# Patient Record
Sex: Female | Born: 1995 | Race: White | Hispanic: No | Marital: Single | Smoking: Never smoker
Health system: Southern US, Community
[De-identification: ages and names within clinical notes are randomized; demographics above are authoritative.]

## PROBLEM LIST (undated history)

## (undated) DIAGNOSIS — F4310 Post-traumatic stress disorder, unspecified: Secondary | ICD-10-CM

## (undated) DIAGNOSIS — F329 Major depressive disorder, single episode, unspecified: Secondary | ICD-10-CM

## (undated) DIAGNOSIS — F419 Anxiety disorder, unspecified: Secondary | ICD-10-CM

## (undated) DIAGNOSIS — F32A Depression, unspecified: Secondary | ICD-10-CM

## (undated) DIAGNOSIS — E079 Disorder of thyroid, unspecified: Secondary | ICD-10-CM

## (undated) HISTORY — PX: FOOT SURGERY: SHX648

---

## 2014-10-02 ENCOUNTER — Encounter (HOSPITAL_COMMUNITY): Payer: Self-pay

## 2014-10-02 ENCOUNTER — Emergency Department (HOSPITAL_COMMUNITY)
Admission: EM | Admit: 2014-10-02 | Discharge: 2014-10-03 | Disposition: A | Discharge status: Home / Self Care | Payer: BC Managed Care – PPO | Attending: Emergency Medicine | Admitting: Emergency Medicine

## 2014-10-02 DIAGNOSIS — R0682 Tachypnea, not elsewhere classified: Secondary | ICD-10-CM | POA: Diagnosis not present

## 2014-10-02 DIAGNOSIS — Z880 Allergy status to penicillin: Secondary | ICD-10-CM | POA: Insufficient documentation

## 2014-10-02 DIAGNOSIS — R0789 Other chest pain: Secondary | ICD-10-CM | POA: Insufficient documentation

## 2014-10-02 DIAGNOSIS — Z79899 Other long term (current) drug therapy: Secondary | ICD-10-CM | POA: Diagnosis not present

## 2014-10-02 DIAGNOSIS — R079 Chest pain, unspecified: Secondary | ICD-10-CM | POA: Diagnosis present

## 2014-10-02 LAB — CBC
HCT: 43.1 % (ref 36.0–46.0)
Hemoglobin: 14.5 g/dL (ref 12.0–15.0)
MCH: 30.3 pg (ref 26.0–34.0)
MCHC: 33.6 g/dL (ref 30.0–36.0)
MCV: 90 fL (ref 78.0–100.0)
Platelets: 331 10*3/uL (ref 150–400)
RBC: 4.79 MIL/uL (ref 3.87–5.11)
RDW: 12.8 % (ref 11.5–15.5)
WBC: 9 10*3/uL (ref 4.0–10.5)

## 2014-10-02 LAB — TROPONIN I

## 2014-10-02 LAB — BASIC METABOLIC PANEL
Anion gap: 10 (ref 5–15)
BUN: 21 mg/dL (ref 6–23)
CALCIUM: 9.4 mg/dL (ref 8.4–10.5)
CO2: 17 mmol/L — ABNORMAL LOW (ref 19–32)
Chloride: 107 mmol/L (ref 96–112)
Creatinine, Ser: 0.89 mg/dL (ref 0.50–1.10)
GFR calc Af Amer: >90 mL/min (ref >90)
GLUCOSE: 104 mg/dL — AB (ref 70–99)
Potassium: 3.9 mmol/L (ref 3.5–5.1)
Sodium: 134 mmol/L — ABNORMAL LOW (ref 135–145)

## 2014-10-02 NOTE — ED Notes (Signed)
Pt reports having chest tightness and not being able to catch a good breath. Pt has labored respirations with complaints of chest pain to the mid chest area and pt states that "it feels as if someone is stepping on me". Pt reports SOB started 2 weeks ago with it getting worst this week and cough since Christmas. Pt alert and oriented x4.

## 2014-10-02 NOTE — ED Notes (Signed)
Bed: [id number] Expected date:  Expected time:  Means of arrival:  Comments: PT CHANGING

## 2014-10-03 ENCOUNTER — Emergency Department (HOSPITAL_COMMUNITY): Payer: BC Managed Care – PPO

## 2014-10-03 LAB — D-DIMER, QUANTITATIVE (NOT AT ARMC): D-Dimer, Quant: <0.27 ug/mL-FEU (ref 0.00–0.48)

## 2014-10-03 MED ORDER — PREDNISONE 50 MG PO TABS: 50.0000 mg | ORAL_TABLET | Freq: Every day | ORAL | Status: DC

## 2014-10-03 MED ORDER — GUAIFENESIN ER 1200 MG PO TB12: 1.0000 | ORAL_TABLET | Freq: Two times a day (BID) | ORAL | Status: DC

## 2014-10-03 MED ORDER — OXYCODONE-ACETAMINOPHEN 5-325 MG PO TABS
1.0000 | ORAL_TABLET | Freq: Once | ORAL | Status: AC
  Administered 2014-10-03: 1 via ORAL
  Filled 2014-10-03: qty 1

## 2014-10-03 MED ORDER — HYDROCODONE-ACETAMINOPHEN 5-325 MG PO TABS: 1.0000 | ORAL_TABLET | Freq: Four times a day (QID) | ORAL | Status: DC | PRN

## 2014-10-03 NOTE — ED Provider Notes (Signed)
CSN: [id number]     Arrival date & time 10/02/14  2202 History   First MD Initiated Contact with Patient 10/02/14 2348     Chief Complaint  Patient presents with  . Shortness of Breath  . Chest Pain     (Consider location/radiation/quality/duration/timing/severity/associated sxs/prior Treatment) HPI Patient presents to the emergency department with a one-month history of cough and started having shortness of breath 2 weeks ago, has gotten worse over the last week.  Patient states that she has had chest pain constantly since yesterday, says it is worse with movement, palpation and deep breathing.  The patient states that she did not take any medications prior to arrival for her symptoms.  Patient denies nausea, vomiting, headache, blurred vision, weakness, dizziness, abdominal pain, fever or syncope.  The patient states that she has had 2 family members have been sick with pneumonia History reviewed. No pertinent past medical history. No past surgical history on file. History reviewed. No pertinent family history. History  Substance Use Topics  . Smoking status: Not on file  . Smokeless tobacco: Not on file  . Alcohol Use: Not on file   OB History    No data available     Review of Systems  All other systems negative except as documented in the HPI. All pertinent positives and negatives as reviewed in the HPI.   Allergies  Aspirin; Codeine; and Penicillins  Home Medications   Prior to Admission medications   Medication Sig Start Date End Date Taking? Authorizing Provider  aspirin-acetaminophen-caffeine (EXCEDRIN MIGRAINE) [phone] MG per tablet Take 1-2 tablets by mouth every 6 (six) hours as needed for migraine.   Yes Historical Provider, MD  medroxyPROGESTERone (DEPO-PROVERA) 150 MG/ML injection Inject 150 mg into the muscle every 3 (three) months.   Yes Historical Provider, MD  sertraline (ZOLOFT) 100 MG tablet Take 100 mg by mouth daily.   Yes Historical Provider, MD    BP 111/75 mmHg  Pulse 84  Temp(Src) 97.8 F (36.6 C) (Oral)  Resp 24  Ht  (1.753 m)  Wt 230 lb (104.327 kg)  BMI 33.95 kg/m2  SpO2 100%  LMP 10/02/2013 (Approximate) Physical Exam  Constitutional: She is oriented to person, place, and time. She appears well-developed and well-nourished. No distress.  HENT:  Head: Normocephalic and atraumatic.  Mouth/Throat: Oropharynx is clear and moist.  Eyes: Pupils are equal, round, and reactive to light.  Neck: Normal range of motion. Neck supple.  Cardiovascular: Normal rate, regular rhythm and normal heart sounds.  Exam reveals no gallop and no friction rub.   No murmur heard. Pulmonary/Chest: Effort normal and breath sounds normal. Tachypnea noted. She has no decreased breath sounds. She has no wheezes. She has no rales. She exhibits tenderness.  Neurological: She is alert and oriented to person, place, and time. She exhibits normal muscle tone. Coordination normal.  Skin: Skin is warm and dry.  Nursing note and vitals reviewed.   ED Course  Procedures (including critical care time) Labs Review Labs Reviewed  BASIC METABOLIC PANEL - Abnormal; Notable for the following:    Sodium 134 (*)    CO2 17 (*)    Glucose, Bld 104 (*)    All other components within normal limits  CBC  TROPONIN I  PREGNANCY, URINE    Imaging Review No results found.   Date: 10/03/2014  Rate: 115  Rhythm: sinus tachycardia  QRS Axis: normal  Intervals: normal  ST/T Wave abnormalities: normal  Conduction Disutrbances:none  Narrative Interpretation:  Old EKG Reviewed: none available  Patient does not have any significant abnormalities other than tachycardia noted on her vital signs the patient will be treated symptomatically.  Patient is advised to return here as needed.  Patient is told to increase her fluid intake.  Follow up with a primary care doctor.  Patient agrees the plan and all questions were answered.  Patient is low risk based on  well's criteria.  Patient denies smoking history to me  MDM   Final diagnoses:  None        Carlyle DollyChristopher W Lathen Seal, PA-C 10/05/14 0715  Olivia Mackielga M Otter, MD 10/05/14 [phone]

## 2014-10-03 NOTE — Discharge Instructions (Signed)
Return here as needed. Follow up with a primary doctor. °

## 2014-10-03 NOTE — ED Provider Notes (Signed)
[id number] - [facility] care [facility] at shift change. Patient pending d-dimer. Plan discussed with Lawyer, PA-C which includes discharge if d-dimer is negative. D-dimer results reviewed which are <0.27. Patient has remained hemodynamically stable without hypoxia. She is stable for discharge at this time. Return precautions given.  Results for orders placed or performed during the hospital encounter of 10/02/14  CBC  Result Value Ref Range   WBC 9.0 4.0 - 10.5 K/uL   RBC 4.79 3.87 - 5.11 MIL/uL   Hemoglobin 14.5 12.0 - 15.0 g/dL   HCT 11.943.1 14.736.0 - 82.946.0 %   MCV 90.0 78.0 - 100.0 fL   MCH 30.3 26.0 - 34.0 pg   MCHC 33.6 30.0 - 36.0 g/dL   RDW 56.212.8 13.011.5 - 86.515.5 %   Platelets 331 150 - 400 K/uL  Basic metabolic panel  Result Value Ref Range   Sodium 134 (L) 135 - 145 mmol/L   Potassium 3.9 3.5 - 5.1 mmol/L   Chloride 107 96 - 112 mmol/L   CO2 17 (L) 19 - 32 mmol/L   Glucose, Bld 104 (H) 70 - 99 mg/dL   BUN 21 6 - 23 mg/dL   Creatinine, Ser 7.840.89 0.50 - 1.10 mg/dL   Calcium 9.4 8.4 - 69.610.5 mg/dL   GFR calc non Af Amer >90 >90 mL/min   GFR calc Af Amer >90 >90 mL/min   Anion gap 10 5 - 15  Troponin I (MHP)  Result Value Ref Range   Troponin I <0.03 <0.031 ng/mL  D-dimer, quantitative  Result Value Ref Range   D-Dimer, Quant <0.27 0.00 - 0.48 ug/mL-FEU   Dg Chest 2 View  10/03/2014   CLINICAL DATA:  Patient is not fell well since 08/30/2014. Increasing shortness of breath for several days. Feels like someone is sitting on her chest.  EXAM: CHEST  2 VIEW  COMPARISON:  None.  FINDINGS: The heart size and mediastinal contours are within normal limits. Both lungs are clear. The visualized skeletal structures are unremarkable.  IMPRESSION: No active cardiopulmonary disease.   Electronically Signed   By: Burman NievesWilliam  Stevens M.D.   On: 10/03/2014 01:17      Antony MaduraKelly Rhylan Kagel, PA-C 10/03/14 0244  Olivia Mackielga M Otter, MD 10/03/14 [phone]

## 2015-05-06 ENCOUNTER — Emergency Department (HOSPITAL_COMMUNITY)
Admission: EM | Admit: 2015-05-06 | Discharge: 2015-05-06 | Disposition: A | Discharge status: Home / Self Care | Payer: BC Managed Care – PPO | Attending: Emergency Medicine | Admitting: Emergency Medicine

## 2015-05-06 ENCOUNTER — Encounter (HOSPITAL_COMMUNITY): Payer: Self-pay | Admitting: Emergency Medicine

## 2015-05-06 DIAGNOSIS — F329 Major depressive disorder, single episode, unspecified: Secondary | ICD-10-CM | POA: Diagnosis not present

## 2015-05-06 DIAGNOSIS — Z7952 Long term (current) use of systemic steroids: Secondary | ICD-10-CM | POA: Diagnosis not present

## 2015-05-06 DIAGNOSIS — R0602 Shortness of breath: Secondary | ICD-10-CM | POA: Diagnosis present

## 2015-05-06 DIAGNOSIS — F4310 Post-traumatic stress disorder, unspecified: Secondary | ICD-10-CM | POA: Diagnosis not present

## 2015-05-06 DIAGNOSIS — F410 Panic disorder [episodic paroxysmal anxiety] without agoraphobia: Secondary | ICD-10-CM | POA: Diagnosis not present

## 2015-05-06 DIAGNOSIS — R000 Tachycardia, unspecified: Secondary | ICD-10-CM | POA: Diagnosis not present

## 2015-05-06 DIAGNOSIS — Z79899 Other long term (current) drug therapy: Secondary | ICD-10-CM | POA: Diagnosis not present

## 2015-05-06 DIAGNOSIS — Z880 Allergy status to penicillin: Secondary | ICD-10-CM | POA: Insufficient documentation

## 2015-05-06 HISTORY — DX: Major depressive disorder, single episode, unspecified: F32.9

## 2015-05-06 HISTORY — DX: Post-traumatic stress disorder, unspecified: F43.10

## 2015-05-06 HISTORY — DX: Depression, unspecified: F32.A

## 2015-05-06 HISTORY — DX: Anxiety disorder, unspecified: F41.9

## 2015-05-06 MED ORDER — ACETAMINOPHEN 500 MG PO TABS
1000.0000 mg | ORAL_TABLET | Freq: Once | ORAL | Status: AC
  Administered 2015-05-06: 1000 mg via ORAL
  Filled 2015-05-06: qty 2

## 2015-05-06 MED ORDER — LORAZEPAM 1 MG PO TABS: 1.0000 mg | ORAL_TABLET | Freq: Once | ORAL | Status: DC

## 2015-05-06 MED ORDER — LORAZEPAM 2 MG/ML IJ SOLN
1.0000 mg | Freq: Once | INTRAMUSCULAR | Status: AC
  Administered 2015-05-06: 1 mg via INTRAVENOUS
  Filled 2015-05-06: qty 1

## 2015-05-06 NOTE — ED Provider Notes (Signed)
CSN: [id number]     Arrival date & time 05/06/15  1912 History   First MD Initiated Contact with Patient 05/06/15 1924     Chief Complaint  Patient presents with  . Panic Attack     (Consider location/radiation/quality/duration/timing/severity/associated sxs/prior Treatment) HPI Comments: Patient with past medical history of anxiety, depression, and PTSD, presents to the emergency department with chief complaint of panic attack. She states that she was hanging out with her friends at [facility] when she began to have some shortness of breath and a panic attack. She states that her panic attacks are rarely this bad, and that her symptoms improve with the help of her service dog. She has not tried taking anything to alleviate her symptoms. There are no aggravating factors.  The history is provided by the patient. No language interpreter was used.    Past Medical History  Diagnosis Date  . Anxiety   . Depression   . PTSD (post-traumatic stress disorder)    Past Surgical History  Procedure Laterality Date  . Foot surgery     No family history on file. Social History  Substance Use Topics  . Smoking status: Never Smoker   . Smokeless tobacco: None  . Alcohol Use: No   OB History    No data available     Review of Systems  Constitutional: Negative for fever and chills.  Respiratory: Negative for shortness of breath.   Cardiovascular: Negative for chest pain.  Gastrointestinal: Negative for nausea, vomiting, diarrhea and constipation.  Genitourinary: Negative for dysuria.  Psychiatric/Behavioral: The patient is nervous/anxious.       Allergies  Aspirin; Codeine; and Penicillins  Home Medications   Prior to Admission medications   Medication Sig Start Date End Date Taking? Authorizing Provider  aspirin-acetaminophen-caffeine (EXCEDRIN MIGRAINE) [phone] MG per tablet Take 1-2 tablets by mouth every 6 (six) hours as needed for migraine.    Historical Provider, MD   Guaifenesin 1200 MG TB12 Take 1 tablet (1,200 mg total) by mouth 2 (two) times daily. 10/03/14   Charlestine Night, PA-C  HYDROcodone-acetaminophen (NORCO/VICODIN) 5-325 MG per tablet Take 1 tablet by mouth every 6 (six) hours as needed for moderate pain. 10/03/14   Charlestine Night, PA-C  medroxyPROGESTERone (DEPO-PROVERA) 150 MG/ML injection Inject 150 mg into the muscle every 3 (three) months.    Historical Provider, MD  predniSONE (DELTASONE) 50 MG tablet Take 1 tablet (50 mg total) by mouth daily with breakfast. 10/03/14   Charlestine Night, PA-C  sertraline (ZOLOFT) 100 MG tablet Take 100 mg by mouth daily.    Historical Provider, MD   BP 117/97 mmHg  Pulse 112  Temp(Src) 98.1 F (36.7 C) (Oral)  Resp 30  SpO2 100% Physical Exam  Constitutional: She is oriented to person, place, and time. She appears well-developed and well-nourished.  HENT:  Head: Normocephalic and atraumatic.  Eyes: Conjunctivae and EOM are normal. Pupils are equal, round, and reactive to light.  Neck: Normal range of motion. Neck supple.  Cardiovascular: Regular rhythm.  Exam reveals no gallop and no friction rub.   No murmur heard. tachycardia  Pulmonary/Chest: Effort normal and breath sounds normal. No respiratory distress. She has no wheezes. She has no rales. She exhibits no tenderness.  tachypnea  Abdominal: Soft. Bowel sounds are normal. She exhibits no distension and no mass. There is no tenderness. There is no rebound and no guarding.  Musculoskeletal: Normal range of motion. She exhibits no edema or tenderness.  Neurological: She is alert and  oriented to person, place, and time.  Skin: Skin is warm and dry.  Psychiatric:  Very anxious  Nursing note and vitals reviewed.   ED Course  Procedures (including critical care time)   MDM   Final diagnoses:  Panic attack    Patient having panic attack. She is hyperventilating. Will give a dose of Ativan. Low risk for ACS, DVT, or PE.  8:06  PM Patient states that she is feeling better.  No longer hyperventilating.  States that she feels a little achy.  Will give a tylenol.  Symptoms seem consistent with panic attack.  Wells PE score is 1.5 indicating unlikely for PE.  ACS risk is low.  Symptoms are improving.  Hx of the same.  Anticipate DC to home.  9:30 PM Patient reassessed.  Symptom free.  DC to home.   Roxy Horseman, PA-C 05/06/15 2130  Tilden Fossa, MD 05/06/15 [phone]

## 2015-05-06 NOTE — Discharge Instructions (Signed)

## 2015-05-06 NOTE — ED Notes (Signed)
Pt was hanging out with friends at [facility] and began to have shortness of breath  Pt has hx of panic attacks  Pt is hyperventilating in triage  Pt has service dog with her

## 2015-06-19 ENCOUNTER — Encounter (HOSPITAL_COMMUNITY): Payer: Self-pay | Admitting: Emergency Medicine

## 2015-06-19 ENCOUNTER — Emergency Department (HOSPITAL_COMMUNITY)
Admission: EM | Admit: 2015-06-19 | Discharge: 2015-06-20 | Disposition: A | Discharge status: Home / Self Care | Payer: PPO | Attending: Emergency Medicine | Admitting: Emergency Medicine

## 2015-06-19 ENCOUNTER — Emergency Department (HOSPITAL_COMMUNITY): Payer: PPO

## 2015-06-19 DIAGNOSIS — F419 Anxiety disorder, unspecified: Secondary | ICD-10-CM | POA: Diagnosis not present

## 2015-06-19 DIAGNOSIS — R0789 Other chest pain: Secondary | ICD-10-CM

## 2015-06-19 DIAGNOSIS — E039 Hypothyroidism, unspecified: Secondary | ICD-10-CM | POA: Insufficient documentation

## 2015-06-19 DIAGNOSIS — F329 Major depressive disorder, single episode, unspecified: Secondary | ICD-10-CM | POA: Insufficient documentation

## 2015-06-19 DIAGNOSIS — R51 Headache: Secondary | ICD-10-CM

## 2015-06-19 DIAGNOSIS — G8929 Other chronic pain: Secondary | ICD-10-CM | POA: Insufficient documentation

## 2015-06-19 DIAGNOSIS — R000 Tachycardia, unspecified: Secondary | ICD-10-CM | POA: Diagnosis not present

## 2015-06-19 DIAGNOSIS — G43909 Migraine, unspecified, not intractable, without status migrainosus: Secondary | ICD-10-CM | POA: Insufficient documentation

## 2015-06-19 DIAGNOSIS — F4310 Post-traumatic stress disorder, unspecified: Secondary | ICD-10-CM | POA: Insufficient documentation

## 2015-06-19 DIAGNOSIS — R079 Chest pain, unspecified: Secondary | ICD-10-CM | POA: Diagnosis present

## 2015-06-19 DIAGNOSIS — R410 Disorientation, unspecified: Secondary | ICD-10-CM | POA: Diagnosis not present

## 2015-06-19 DIAGNOSIS — Z880 Allergy status to penicillin: Secondary | ICD-10-CM | POA: Diagnosis not present

## 2015-06-19 DIAGNOSIS — R519 Headache, unspecified: Secondary | ICD-10-CM

## 2015-06-19 HISTORY — DX: Disorder of thyroid, unspecified: E07.9

## 2015-06-19 LAB — BASIC METABOLIC PANEL
ANION GAP: 13 (ref 5–15)
BUN: 12 mg/dL (ref 6–20)
CHLORIDE: 107 mmol/L (ref 101–111)
CO2: 17 mmol/L — AB (ref 22–32)
Calcium: 9.9 mg/dL (ref 8.9–10.3)
Creatinine, Ser: 0.86 mg/dL (ref 0.44–1.00)
GFR calc non Af Amer: >60 mL/min (ref >60)
Glucose, Bld: 121 mg/dL — ABNORMAL HIGH (ref 65–99)
POTASSIUM: 4 mmol/L (ref 3.5–5.1)
Sodium: 137 mmol/L (ref 135–145)

## 2015-06-19 LAB — CBC
HEMATOCRIT: 48.6 % — AB (ref 36.0–46.0)
HEMOGLOBIN: 16.4 g/dL — AB (ref 12.0–15.0)
MCH: 30.6 pg (ref 26.0–34.0)
MCHC: 33.7 g/dL (ref 30.0–36.0)
MCV: 90.7 fL (ref 78.0–100.0)
Platelets: 351 10*3/uL (ref 150–400)
RBC: 5.36 MIL/uL — AB (ref 3.87–5.11)
RDW: 12.6 % (ref 11.5–15.5)
WBC: 13 10*3/uL — ABNORMAL HIGH (ref 4.0–10.5)

## 2015-06-19 LAB — I-STAT TROPONIN, ED: TROPONIN I, POC: 0 ng/mL (ref 0.00–0.08)

## 2015-06-19 MED ORDER — SODIUM CHLORIDE 0.9 % IV BOLUS (SEPSIS)
2000.0000 mL | Freq: Once | INTRAVENOUS | Status: AC
  Administered 2015-06-19: 2000 mL via INTRAVENOUS

## 2015-06-19 NOTE — ED Provider Notes (Signed)
CSN: [id number]   Arrival date & time 06/19/15 2123  History  By signing my name below, I, Bethel Born, attest that this documentation has been prepared under the direction and in the presence of Loren Racer, MD. Electronically Signed: Bethel Born, ED Scribe. 06/19/2015. 11:23 PM.  Chief Complaint  Patient presents with  . Chest Pain  . Anxiety  . Hypothyroidism  . Migraine    HPI The history is provided by the patient. No language interpreter was used.   Mary Contreras is a 19 y.o. female with history of hypothyroidism, anxiety, depression, and PTSD who presents to the Emergency Department complaining of constant central chest pain. Pt states that she does not know when the pain started and is "really confused".  She describes the pain as severe pressure. Associated symptoms include posterior headache. Pt has 2-3 headaches per day stating that "they don't usually go away". Today she used Excedrin with insufficient pain relief and does not remember how much of the medication she took. No sore throat, SOB, or new leg pain. She was recently started on Levothyroxine and states that she has been using it as prescribed. No personal history of DVT/PE. Pt is not on birth control. She denies alcohol and illicit drug use.  Past Medical History  Diagnosis Date  . Anxiety   . Depression   . PTSD (post-traumatic stress disorder)   . Thyroid disease     Past Surgical History  Procedure Laterality Date  . Foot surgery      History reviewed. No pertinent family history.  Social History  Substance Use Topics  . Smoking status: Never Smoker   . Smokeless tobacco: None  . Alcohol Use: No     Review of Systems  Constitutional: Negative for fever and chills.  Respiratory: Negative for shortness of breath.   Cardiovascular: Positive for chest pain. Negative for leg swelling.  Gastrointestinal: Negative for nausea, vomiting and abdominal pain.  Musculoskeletal: Negative for back  pain, neck pain and neck stiffness.  Skin: Negative for rash and wound.  Neurological: Positive for headaches. Negative for dizziness, weakness, light-headedness and numbness.  Psychiatric/Behavioral: Positive for confusion.  All other systems reviewed and are negative.  Home Medications   Prior to Admission medications   Medication Sig Start Date End Date Taking? Authorizing Provider  aspirin-acetaminophen-caffeine (EXCEDRIN MIGRAINE) [phone] MG tablet Take 2 tablets by mouth every 6 (six) hours as needed for headache or migraine.   Yes Historical Provider, MD  levothyroxine (SYNTHROID, LEVOTHROID) 50 MCG tablet Take 1 tablet by mouth daily. 06/10/15  Yes Historical Provider, MD  sertraline (ZOLOFT) 100 MG tablet Take 100 mg by mouth daily.   Yes Historical Provider, MD  Guaifenesin 1200 MG TB12 Take 1 tablet (1,200 mg total) by mouth 2 (two) times daily. Patient not taking: Reported on 05/06/2015 10/03/14   Charlestine Night, PA-C  metoCLOPramide (REGLAN) 10 MG tablet Take 1 tablet (10 mg total) by mouth every 6 (six) hours as needed for nausea (nausea/headache). 06/20/15   Loren Racer, MD    Allergies  Aspirin; Codeine; and Penicillins  Triage Vitals: BP 157/82 mmHg  Pulse 127  Temp(Src) 97.5 F (36.4 C) (Oral)  Resp 29  Ht  (1.778 m)  Wt 280 lb (127.007 kg)  BMI 40.18 kg/m2  SpO2 100%  LMP   Physical Exam  Constitutional: She is oriented to person, place, and time. She appears well-developed and well-nourished. No distress.  Drowsy but arousable  HENT:  Head: Normocephalic and atraumatic.  Mouth/Throat: Oropharynx is clear and moist. No oropharyngeal exudate.  Eyes: EOM are normal. Pupils are equal, round, and reactive to light.  Neck: Normal range of motion. Neck supple.  No meningismus. No posterior midline cervical tenderness to palpation.  Cardiovascular: Regular rhythm.  Exam reveals no gallop and no friction rub.   No murmur heard. Tachycardia   Pulmonary/Chest: Effort normal and breath sounds normal. No respiratory distress. She has no wheezes. She has no rales. She exhibits no tenderness.  Abdominal: Soft. Bowel sounds are normal. She exhibits no distension and no mass. There is no tenderness. There is no rebound and no guarding.  Musculoskeletal: Normal range of motion. She exhibits no edema or tenderness.  No lower extremity swelling or tenderness.  Neurological: She is alert and oriented to person, place, and time.  Patient oriented x3 with clear, goal oriented speech. Patient has 5/5 motor in all extremities. Sensation is intact to light touch. Bilateral finger-to-nose is normal with no signs of dysmetria. Patient has a normal gait and walks without assistance.  Skin: Skin is warm and dry. No rash noted. No erythema.  Nursing note and vitals reviewed.   ED Course  Procedures   DIAGNOSTIC STUDIES: Oxygen Saturation is 100% on RA, normal by my interpretation.    COORDINATION OF CARE: 11:21 PM Discussed treatment plan which includes lab work, CXR, and EKG with pt at bedside and pt agreed to plan.  1:28 AM I re-evaluated the patient and provided an update on the results of her lab work. She feels better.  Labs Reviewed  BASIC METABOLIC PANEL - Abnormal; Notable for the following:    CO2 17 (*)    Glucose, Bld 121 (*)    All other components within normal limits  CBC - Abnormal; Notable for the following:    WBC 13.0 (*)    RBC 5.36 (*)    Hemoglobin 16.4 (*)    HCT 48.6 (*)    All other components within normal limits  URINALYSIS, ROUTINE W REFLEX MICROSCOPIC (NOT AT [facility]) - Abnormal; Notable for the following:    Color, Urine AMBER (*)    APPearance CLOUDY (*)    Specific Gravity, Urine >1.046 (*)    Hgb urine dipstick LARGE (*)    Bilirubin Urine SMALL (*)    All other components within normal limits  HEPATIC FUNCTION PANEL - Abnormal; Notable for the following:    Bilirubin, Direct <0.1 (*)    All other  components within normal limits  URINE MICROSCOPIC-ADD ON - Abnormal; Notable for the following:    Squamous Epithelial / LPF FEW (*)    All other components within normal limits  ACETAMINOPHEN LEVEL  ETHANOL  URINE RAPID DRUG SCREEN, HOSP PERFORMED  TSH  D-DIMER, QUANTITATIVE (NOT AT [facility])  I-STAT TROPOININ, ED  Rosezena Sensor, ED    Imaging Review Dg Chest 2 View  06/19/2015  CLINICAL DATA:  19 year old female with acute chest pain and shortness of breath. EXAM: CHEST  2 VIEW COMPARISON:  None. FINDINGS: The cardiomediastinal silhouette is unremarkable. There is no evidence of focal airspace disease, pulmonary edema, suspicious pulmonary nodule/mass, pleural effusion, or pneumothorax. No acute bony abnormalities are identified. IMPRESSION: No active cardiopulmonary disease. Electronically Signed   By: Harmon Pier M.D.   On: 06/19/2015 22:24    I personally reviewed and evaluated these images and lab results as a part of my medical decision-making.   EKG Interpretation  Date/Time:  Thursday June 19 2015 21:36:50 EDT Ventricular Rate:  132 PR Interval:  138 QRS Duration: 73 QT Interval:  289 QTC Calculation: 428 R Axis:   91 Text Interpretation:  Sinus tachycardia Ventricular premature complex Borderline right axis deviation Baseline wander in lead(s) V3 V4 Confirmed by Ranae PalmsYELVERTON  MD, Kolin Erdahl ([id number]) on 06/19/2015 11:05:11 PM    MDM   Final diagnoses:  Chronic nonintractable headache, unspecified headache type  Atypical chest pain    I, Contina Strain, personally performed the services described in this documentation. All medical record entries made by the scribe were at my direction and in my presence.  I have reviewed the chart and discharge instructions and agree that the record reflects my personal performance and is accurate and complete. Chung Chagoya.  06/20/2015. 2:47 AM.    Patient is much more alert. Heart rate is under 100. Continues to complain of  headache but normal neurologic exam. Patient has chronic history of headaches daily. Initial EKG and troponin are normal. Low suspicion for coronary artery disease. D-dimer is negative. TSH is normal.  Patient states her headache is improved. Continues to have normal neurologic exam. We'll give referral to neurologist for chronic headaches. Return precautions given.  Loren Raceravid Noam Karaffa, MD 06/20/15 [phone]

## 2015-06-19 NOTE — ED Notes (Addendum)
Pt reports recent diagnosis with hypothyroidism (started on Levothyroxine). C/o mid chest pain with associated SOB. Pt appears very anxious at this time. Has hx panic attacks but says this does not feel similar. Also c/o intermittent migraines with pain at the base of her neck. Takes Excedrin for migraines- says, "I'm unsure how many I took today but it's not working." Also says, "I think I've been having seizures on and off. My friends say I look like I lose consciousness sometimes." No hx seizures, is not on seizure medication. Pt is alert in triage-appears to be hyperventilating. No nausea/vomiting/diarrhea. No other c/c.

## 2015-06-20 LAB — URINALYSIS, ROUTINE W REFLEX MICROSCOPIC
GLUCOSE, UA: NEGATIVE mg/dL
Ketones, ur: NEGATIVE mg/dL
LEUKOCYTES UA: NEGATIVE
Nitrite: NEGATIVE
PH: 5.5 (ref 5.0–8.0)
Protein, ur: NEGATIVE mg/dL
Urobilinogen, UA: 0.2 mg/dL (ref 0.0–1.0)

## 2015-06-20 LAB — HEPATIC FUNCTION PANEL
ALK PHOS: 73 U/L (ref 38–126)
ALT: 24 U/L (ref 14–54)
AST: 29 U/L (ref 15–41)
Albumin: 4.1 g/dL (ref 3.5–5.0)
BILIRUBIN TOTAL: 0.6 mg/dL (ref 0.3–1.2)
Total Protein: 7.5 g/dL (ref 6.5–8.1)

## 2015-06-20 LAB — D-DIMER, QUANTITATIVE (NOT AT ARMC)

## 2015-06-20 LAB — URINE MICROSCOPIC-ADD ON

## 2015-06-20 LAB — RAPID URINE DRUG SCREEN, HOSP PERFORMED
Amphetamines: NOT DETECTED
BARBITURATES: NOT DETECTED
BENZODIAZEPINES: NOT DETECTED
COCAINE: NOT DETECTED
OPIATES: NOT DETECTED
TETRAHYDROCANNABINOL: NOT DETECTED

## 2015-06-20 LAB — ACETAMINOPHEN LEVEL: Acetaminophen (Tylenol), Serum: 30 ug/mL (ref 10–30)

## 2015-06-20 LAB — I-STAT TROPONIN, ED: TROPONIN I, POC: 0 ng/mL (ref 0.00–0.08)

## 2015-06-20 LAB — ETHANOL: Alcohol, Ethyl (B): <5 mg/dL (ref <5)

## 2015-06-20 LAB — TSH: TSH: 3.224 u[IU]/mL (ref 0.350–4.500)

## 2015-06-20 MED ORDER — METOCLOPRAMIDE HCL 5 MG/ML IJ SOLN
10.0000 mg | Freq: Once | INTRAMUSCULAR | Status: AC
  Administered 2015-06-20: 10 mg via INTRAVENOUS
  Filled 2015-06-20: qty 2

## 2015-06-20 MED ORDER — METHYLPREDNISOLONE SODIUM SUCC 125 MG IJ SOLR
125.0000 mg | Freq: Once | INTRAMUSCULAR | Status: AC
  Administered 2015-06-20: 125 mg via INTRAVENOUS
  Filled 2015-06-20: qty 2

## 2015-06-20 MED ORDER — KETOROLAC TROMETHAMINE 30 MG/ML IJ SOLN
30.0000 mg | Freq: Once | INTRAMUSCULAR | Status: AC
  Administered 2015-06-20: 30 mg via INTRAVENOUS
  Filled 2015-06-20: qty 1

## 2015-06-20 MED ORDER — METOCLOPRAMIDE HCL 10 MG PO TABS: 10.0000 mg | ORAL_TABLET | Freq: Four times a day (QID) | ORAL | Status: AC | PRN

## 2015-06-20 NOTE — Discharge Instructions (Signed)

## 2015-06-20 NOTE — ED Notes (Signed)
Second liter of NS started per order

## 2015-06-20 NOTE — ED Notes (Signed)
MD at bedside. 

## 2015-06-21 ENCOUNTER — Encounter (HOSPITAL_COMMUNITY): Payer: Self-pay | Admitting: Emergency Medicine

## 2015-06-21 ENCOUNTER — Emergency Department (HOSPITAL_COMMUNITY)
Admission: EM | Admit: 2015-06-21 | Discharge: 2015-06-21 | Disposition: A | Discharge status: Home / Self Care | Payer: PPO | Attending: Emergency Medicine | Admitting: Emergency Medicine

## 2015-06-21 ENCOUNTER — Emergency Department (HOSPITAL_COMMUNITY): Payer: PPO

## 2015-06-21 DIAGNOSIS — R110 Nausea: Secondary | ICD-10-CM | POA: Insufficient documentation

## 2015-06-21 DIAGNOSIS — R109 Unspecified abdominal pain: Secondary | ICD-10-CM | POA: Diagnosis not present

## 2015-06-21 DIAGNOSIS — R569 Unspecified convulsions: Secondary | ICD-10-CM | POA: Diagnosis present

## 2015-06-21 DIAGNOSIS — Z79899 Other long term (current) drug therapy: Secondary | ICD-10-CM | POA: Insufficient documentation

## 2015-06-21 DIAGNOSIS — R51 Headache: Secondary | ICD-10-CM | POA: Insufficient documentation

## 2015-06-21 DIAGNOSIS — Z3202 Encounter for pregnancy test, result negative: Secondary | ICD-10-CM | POA: Diagnosis not present

## 2015-06-21 DIAGNOSIS — F4310 Post-traumatic stress disorder, unspecified: Secondary | ICD-10-CM | POA: Insufficient documentation

## 2015-06-21 DIAGNOSIS — Z880 Allergy status to penicillin: Secondary | ICD-10-CM | POA: Insufficient documentation

## 2015-06-21 DIAGNOSIS — F329 Major depressive disorder, single episode, unspecified: Secondary | ICD-10-CM | POA: Diagnosis not present

## 2015-06-21 DIAGNOSIS — R55 Syncope and collapse: Secondary | ICD-10-CM | POA: Insufficient documentation

## 2015-06-21 DIAGNOSIS — R000 Tachycardia, unspecified: Secondary | ICD-10-CM | POA: Insufficient documentation

## 2015-06-21 DIAGNOSIS — E079 Disorder of thyroid, unspecified: Secondary | ICD-10-CM | POA: Insufficient documentation

## 2015-06-21 DIAGNOSIS — F419 Anxiety disorder, unspecified: Secondary | ICD-10-CM | POA: Insufficient documentation

## 2015-06-21 DIAGNOSIS — R4020 Unspecified coma: Secondary | ICD-10-CM

## 2015-06-21 LAB — BASIC METABOLIC PANEL
Anion gap: 9 (ref 5–15)
BUN: 14 mg/dL (ref 6–20)
CHLORIDE: 110 mmol/L (ref 101–111)
CO2: 22 mmol/L (ref 22–32)
CREATININE: 0.93 mg/dL (ref 0.44–1.00)
Calcium: 8.9 mg/dL (ref 8.9–10.3)
GFR calc Af Amer: >60 mL/min (ref >60)
GFR calc non Af Amer: >60 mL/min (ref >60)
Glucose, Bld: 94 mg/dL (ref 65–99)
POTASSIUM: 3.6 mmol/L (ref 3.5–5.1)
SODIUM: 141 mmol/L (ref 135–145)

## 2015-06-21 LAB — CBC
HEMATOCRIT: 44.3 % (ref 36.0–46.0)
HEMOGLOBIN: 14.5 g/dL (ref 12.0–15.0)
MCH: 30.9 pg (ref 26.0–34.0)
MCHC: 32.7 g/dL (ref 30.0–36.0)
MCV: 94.3 fL (ref 78.0–100.0)
Platelets: 322 10*3/uL (ref 150–400)
RBC: 4.7 MIL/uL (ref 3.87–5.11)
RDW: 13 % (ref 11.5–15.5)
WBC: 8.5 10*3/uL (ref 4.0–10.5)

## 2015-06-21 LAB — CBG MONITORING, ED: Glucose-Capillary: 98 mg/dL (ref 65–99)

## 2015-06-21 LAB — HCG, QUANTITATIVE, PREGNANCY

## 2015-06-21 MED ORDER — DEXAMETHASONE 2 MG PO TABS
1.0000 mg | ORAL_TABLET | Freq: Once | ORAL | Status: AC
  Administered 2015-06-21: 1 mg via ORAL
  Filled 2015-06-21: qty 1

## 2015-06-21 MED ORDER — DIPHENHYDRAMINE HCL 25 MG PO CAPS
25.0000 mg | ORAL_CAPSULE | Freq: Once | ORAL | Status: AC
  Administered 2015-06-21: 25 mg via ORAL
  Filled 2015-06-21: qty 1

## 2015-06-21 MED ORDER — METOCLOPRAMIDE HCL 10 MG PO TABS
5.0000 mg | ORAL_TABLET | Freq: Once | ORAL | Status: AC
  Administered 2015-06-21: 5 mg via ORAL
  Filled 2015-06-21: qty 1

## 2015-06-21 MED ORDER — SODIUM CHLORIDE 0.9 % IV BOLUS (SEPSIS)
1000.0000 mL | Freq: Once | INTRAVENOUS | Status: AC
  Administered 2015-06-21: 1000 mL via INTRAVENOUS

## 2015-06-21 NOTE — ED Notes (Signed)
Nurse getting blood 

## 2015-06-21 NOTE — Discharge Instructions (Signed)

## 2015-06-21 NOTE — ED Notes (Signed)
Pt arrived to the ED with a complaint of a seizure.  Pt states she had a witnessed seizure 30 minutes ago.  Pt's friend states the seizure was for five minutes.  Pt states that she has had a lot going on and has been seen here recently.  Pt is not on any medications for seizures.

## 2015-06-21 NOTE — ED Provider Notes (Signed)
CSN: [id number]     Arrival date & time 06/21/15  1957 History   First MD Initiated Contact with Patient 06/21/15 2023     Chief Complaint  Patient presents with  . Seizures     (Consider location/radiation/quality/duration/timing/severity/associated sxs/prior Treatment) HPI Mary Contreras is a 19 y.o. female with PMH significant for anxiety, depression, PTSD, and thyroid disease who presents with seizure.  Patient reports that she has been experiencing seizures for the past month and they have increased in frequency to once a day.  Last seizure was today at 8 PM.  Patient states that she begins to have pins and needles feeling in her lower extremities and pain at the back of her head with visual disturbances (sees stars) and then has LOC and has jerking movements that begin in the legs and work towards her head.  Friend in the room states that they are jerking movements and the entire episode lasted about 5 minutes.  Patient reports that after the event her legs feel very stiff.  Friend reports that she seems lethargic and "out of it" for about 5 minutes.  Patient states she has never had any injury from these episodes.  Patient states that she goes to [facility] because it is free as she is a [employer]ing [employer] at [employer].  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 It appears she was brought in tonight by her friends who insisted that she go.  When asked why she had not mentioned these episodes to her PCP or another healthcare provider she said "I don't know.  i don't know why I don't tell people things".   Past Medical History  Diagnosis Date  . Anxiety   . Depression   . PTSD (post-traumatic stress disorder)   . Thyroid disease    Past Surgical History  Procedure Laterality Date  . Foot surgery     History reviewed. No pertinent family history. Social History  Substance Use Topics  . Smoking status:  Never Smoker   . Smokeless tobacco: None  . Alcohol Use: No   OB History    No data available     Review of Systems  Constitutional: Negative for chills and fatigue.  Respiratory: Negative for shortness of breath.   Cardiovascular: Negative for chest pain.  Gastrointestinal: Positive for nausea and abdominal pain. Negative for vomiting.  Genitourinary: Negative.   Neurological: Positive for headaches. Negative for dizziness, weakness, light-headedness and numbness.      Allergies  Aspirin; Codeine; and Penicillins  Home Medications   Prior to Admission medications   Medication Sig Start Date End Date Taking? Authorizing Provider  levothyroxine (SYNTHROID, LEVOTHROID) 50 MCG tablet Take 1 tablet by mouth daily. 06/10/15  Yes Historical Provider, MD  sertraline (ZOLOFT) 100 MG tablet Take 100 mg by mouth daily.   Yes Historical Provider, MD  aspirin-acetaminophen-caffeine (EXCEDRIN MIGRAINE) [phone] MG tablet Take 2 tablets by mouth every 6 (six) hours as needed for headache or migraine.    Historical Provider, MD  metoCLOPramide (REGLAN) 10 MG tablet Take 1 tablet (10 mg total) by mouth every 6 (six) hours as needed for nausea (nausea/headache). 06/20/15   Loren Raceravid [place]el[place], MD   BP 131/64 mmHg  Pulse 90  Temp(Src) 98.1 F (36.7 C) (Oral)  Resp 17  Ht 5\' 10"  (1.778 m)  Wt 280 lb (127.007 kg)  BMI 40.18 kg/m2  SpO2 100% Physical  Exam  Constitutional: She is oriented to person, place, and time. She appears well-developed and well-nourished.  HENT:  Head: Normocephalic and atraumatic.  Mouth/Throat: Oropharynx is clear and moist.  Eyes: Conjunctivae and EOM are normal. Pupils are equal, round, and reactive to light.  Neck: Normal range of motion. Neck supple.  Cardiovascular: Normal rate, regular rhythm and normal heart sounds.   No murmur heard. Pulmonary/Chest: Effort normal and breath sounds normal. No accessory muscle usage or stridor. No respiratory distress. She has  no wheezes. She has no rhonchi. She has no rales.  Abdominal: Soft. Bowel sounds are normal. She exhibits no distension. There is no tenderness.  Musculoskeletal: Normal range of motion.  Lymphadenopathy:    She has no cervical adenopathy.  Neurological: She is alert and oriented to person, place, and time.  Speech clear without dysarthria.  Cranial nerves grossly intact.  Strength and sensation intact b/l throughout upper extremities.  Sensation and strength intact in lower extremities, decreased bilaterally.  RAMs intact.  Finger to nose intact without ataxia.  Skin: Skin is warm and dry.  Psychiatric: She has a normal mood and affect. Her behavior is normal.    ED Course  Procedures (including critical care time) Labs Review Labs Reviewed  BASIC METABOLIC PANEL  HCG, QUANTITATIVE, PREGNANCY  CBC  CBG MONITORING, ED    Imaging Review Ct Head Wo Contrast  06/21/2015  CLINICAL DATA:  Seizures EXAM: CT HEAD WITHOUT CONTRAST TECHNIQUE: Contiguous axial images were obtained from the base of the skull through the vertex without intravenous contrast. COMPARISON:  None. FINDINGS: There is no evidence of mass effect, midline shift or extra-axial fluid collections. There is no evidence of a space-occupying lesion or intracranial hemorrhage. There is no evidence of a cortical-based area of acute infarction. The ventricles and sulci are appropriate for the patient's age. The basal cisterns are patent. Visualized portions of the orbits are unremarkable. The visualized portions of the paranasal sinuses and mastoid air cells are unremarkable. The osseous structures are unremarkable. IMPRESSION: Normal CT of the brain without intravenous contrast. Electronically Signed   By: Elige Ko   On: 06/21/2015 21:36   I have personally reviewed and evaluated these images and lab results as part of my medical decision-making.   EKG Interpretation None      MDM   Final diagnoses:  Loss of consciousness     Patient presents with possible seizure.  VSS, mild tachycardia on arrival.  Afebrile.  On exam, no focal neurological deficits.  Decreased sensation and strength in lower extremities bilaterally.  Abdomen soft, nontender, no rebound or guarding.  Lungs CTAB, heart RRR.  Labs include BMP, hcg, CBC, CBG.  Imaging include head CT.  Will consult neurology.   -Head CT shows normal CT of brain. -BMP and CBC unremarkable -hcg negative -UDS from previous ED visit negative  Per neurology, no driving or operative heavy machinery until further evaluation.  No medication initiation at this time. Patient stable for discharge.  Patient agrees and acknowledges the above plan for discharge.  Case has been discussed with Dr. Freida Busman who agrees with the above plan for discharge.    Cheri Fowler, PA-C 06/21/15 2242  Lorre Nick, MD 06/22/15 [phone]

## 2015-06-21 NOTE — ED Notes (Signed)
Patient transported to CT 

## 2016-01-09 IMAGING — CR DG CHEST 2V
2 series · 2 of 2 positions shown · non-contrast
Comparison: None.

CLINICAL DATA: 19-year-old female with acute chest pain and
shortness of breath.

EXAM:
CHEST  2 VIEW

[w chest lat]
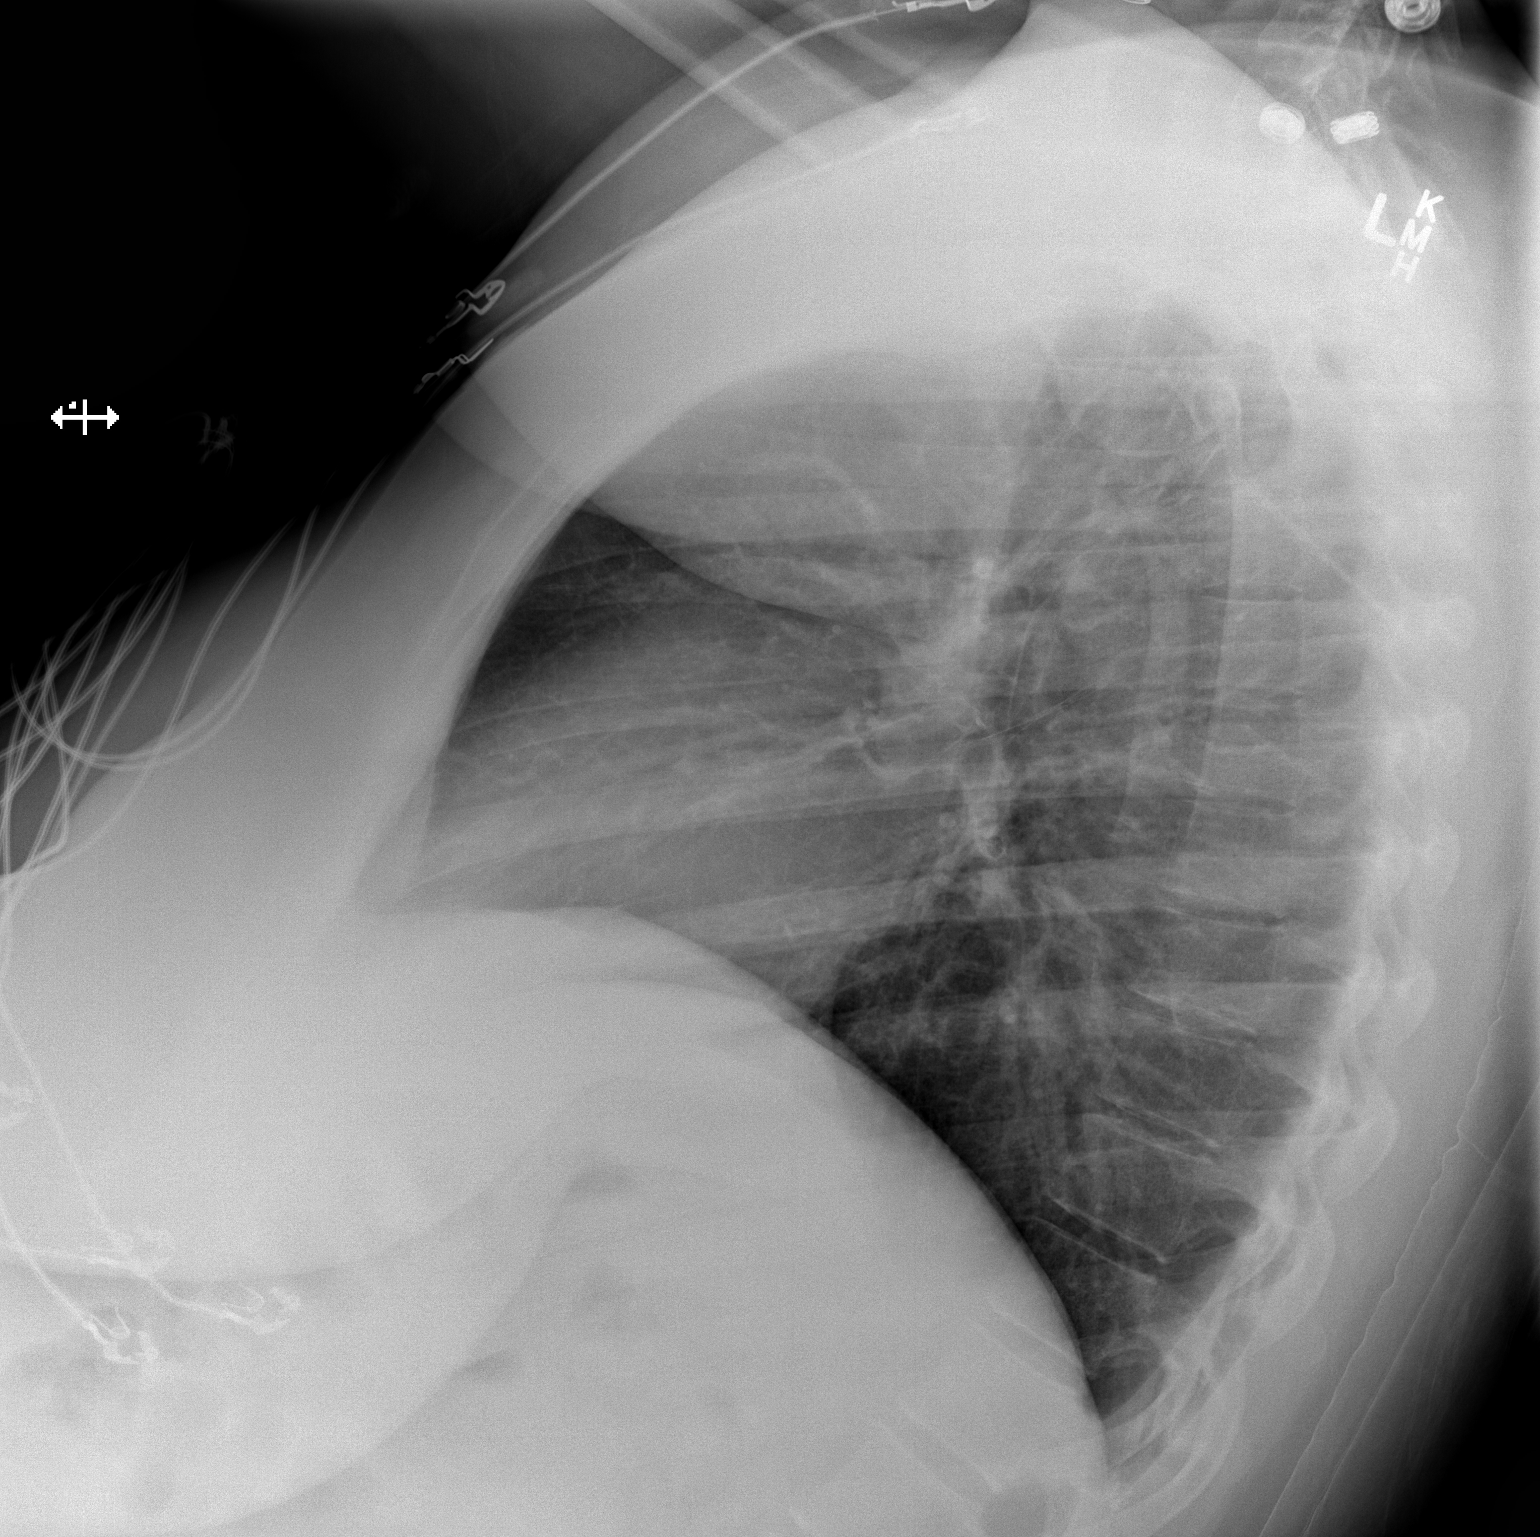

[x chest ap]
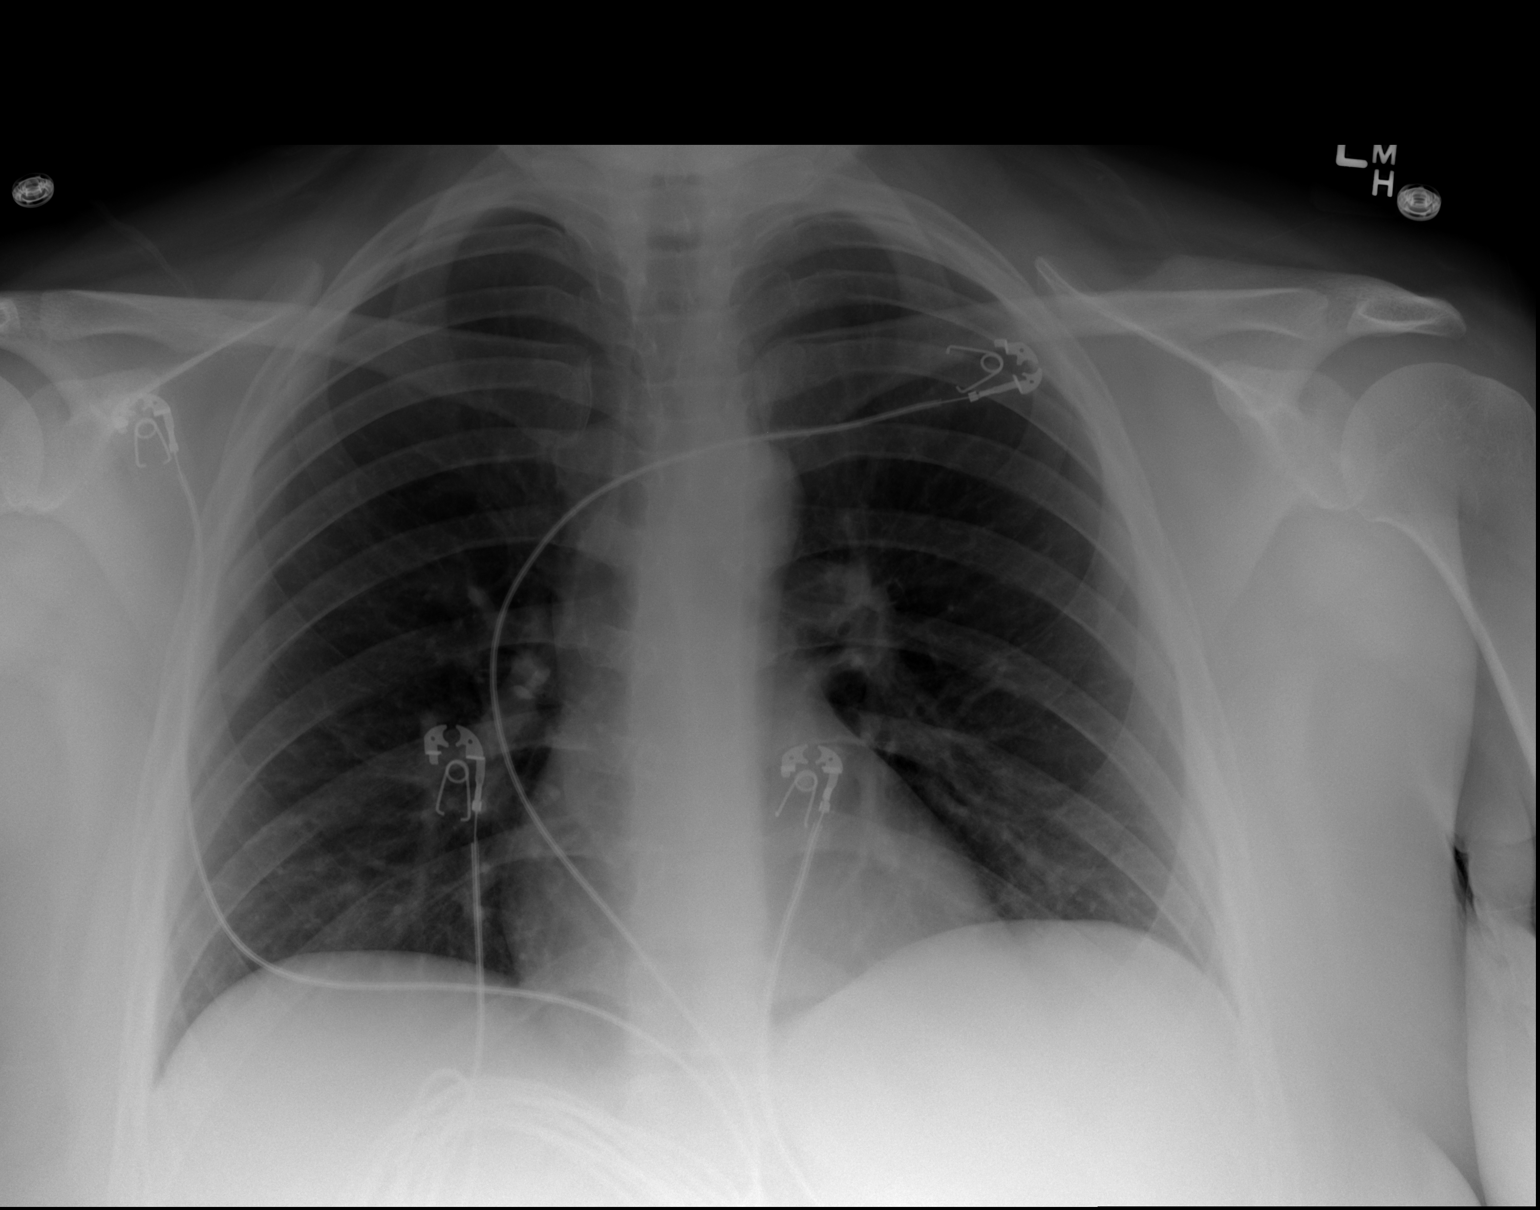

[2 of 2 positions shown; findings below may reference images not displayed]

FINDINGS: The cardiomediastinal silhouette is unremarkable.

There is no evidence of focal airspace disease, pulmonary edema,
suspicious pulmonary nodule/mass, pleural effusion, or pneumothorax.

No acute bony abnormalities are identified.
IMPRESSION: No active cardiopulmonary disease.

## 2016-01-11 IMAGING — CT CT HEAD W/O CM
2 series · 17 of 30 positions shown, 20 images · non-contrast
Comparison: None.

CLINICAL DATA: Seizures

EXAM:
CT HEAD WITHOUT CONTRAST
TECHNIQUE: Contiguous axial images were obtained from the base of the skull
through the vertex without intravenous contrast.

[Series 2: head w/o · axial · non-contrast · 0.45mm/px · z∈[-119,+1]mm · 9 of 32 slices shown, 12 images]
[im 4/32  brain]
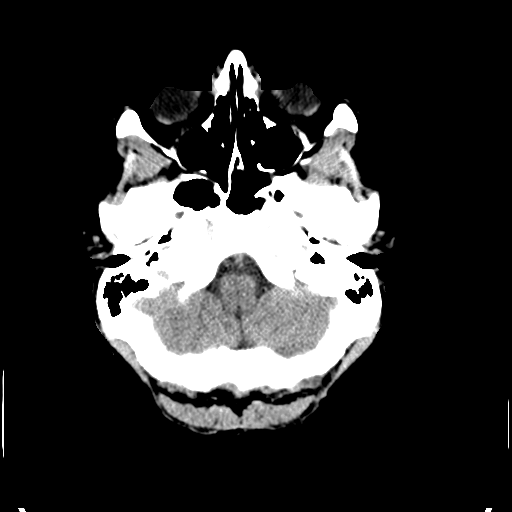
[im 4/32  bone]
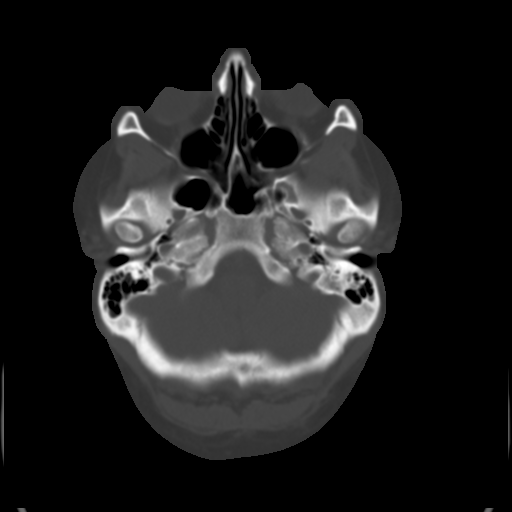
[im 7/32  brain]
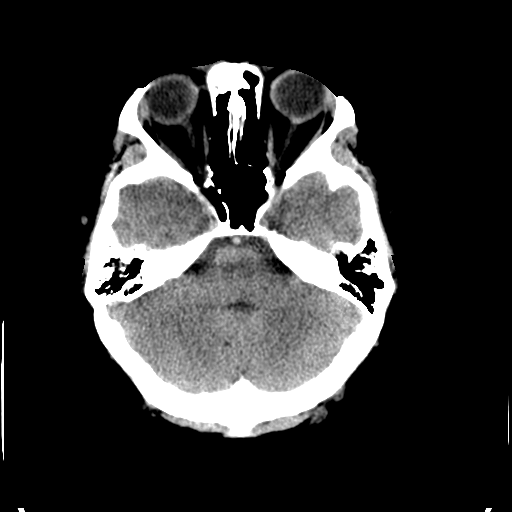
[im 10/32  brain]
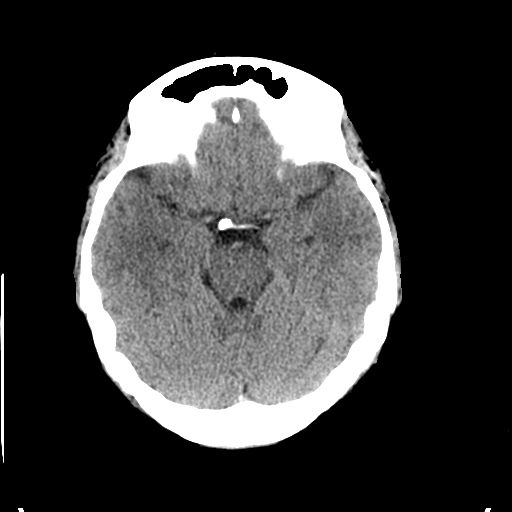
[im 13/32  brain]
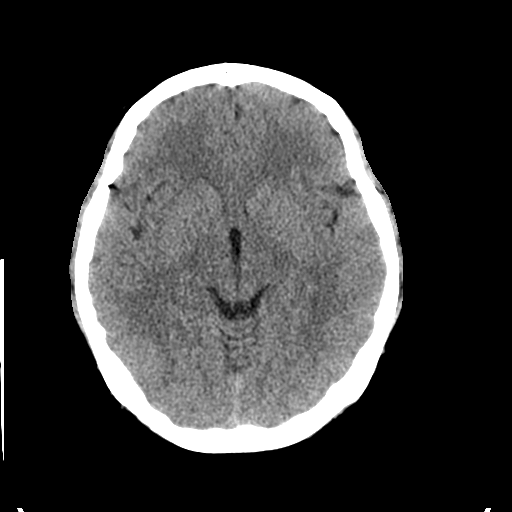
[im 16/32  brain]
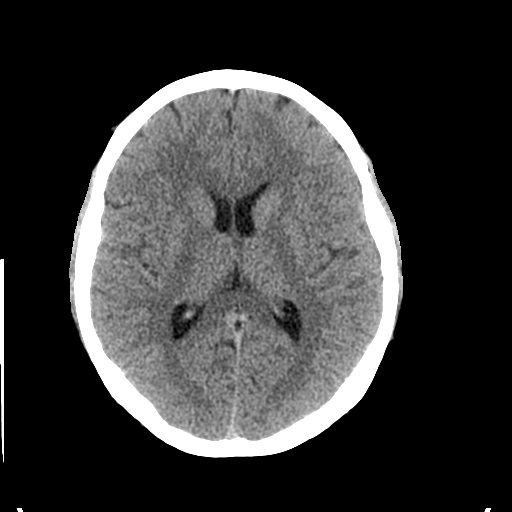
[im 16/32  bone]
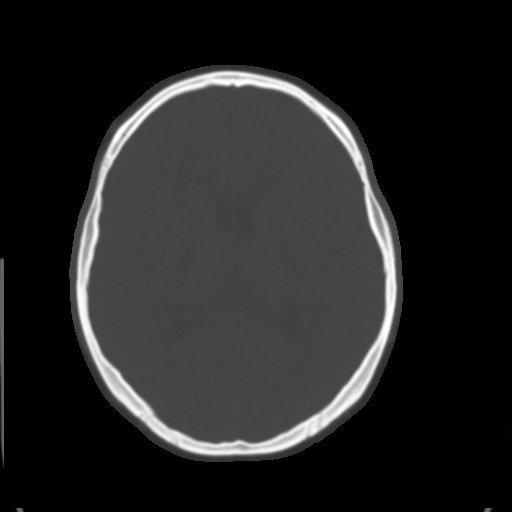
[im 19/32  brain]
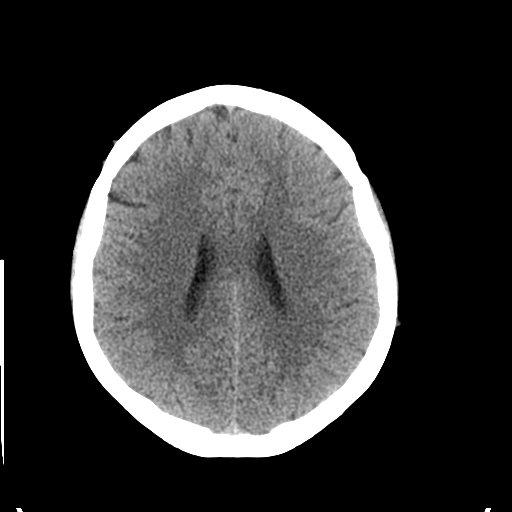
[im 22/32  brain]
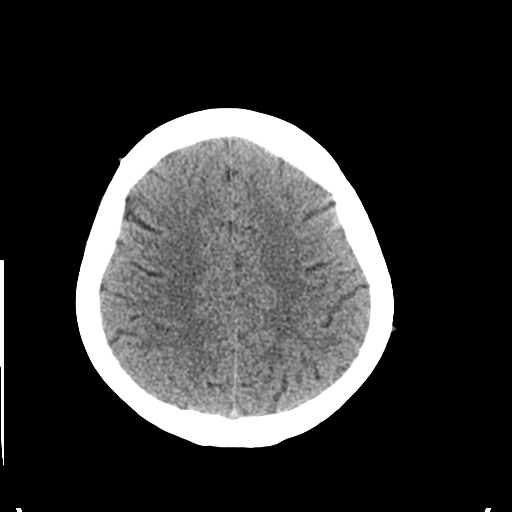
[im 25/32  brain]
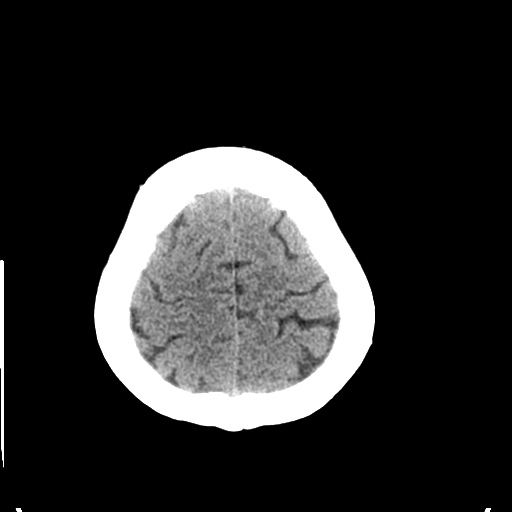
[im 28/32  brain]
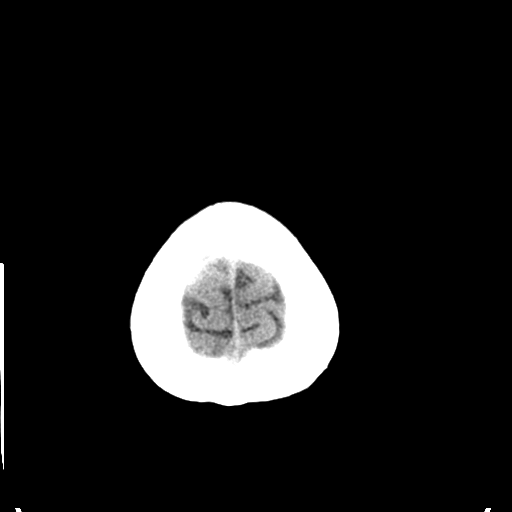
[im 28/32  bone]
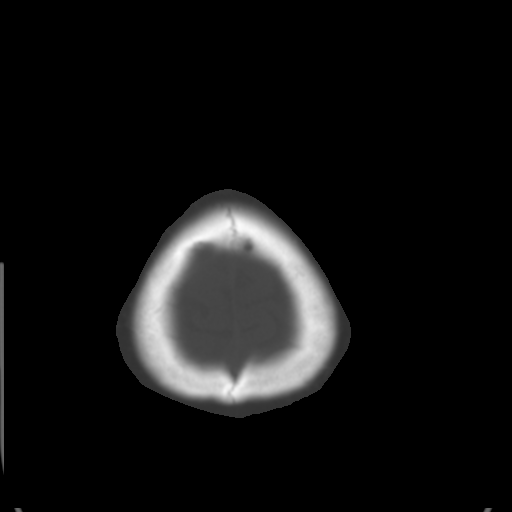

[Series 3: bone windows · axial · 0.45mm/px · z∈[-119,+4]mm · 8 of 53 slices shown]
[im 6/53  bone]
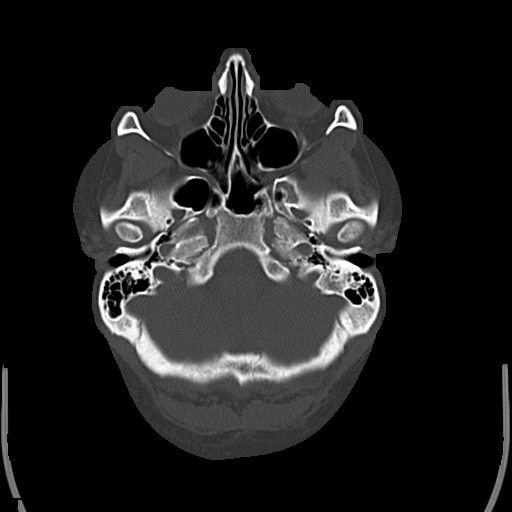
[im 12/53  bone]
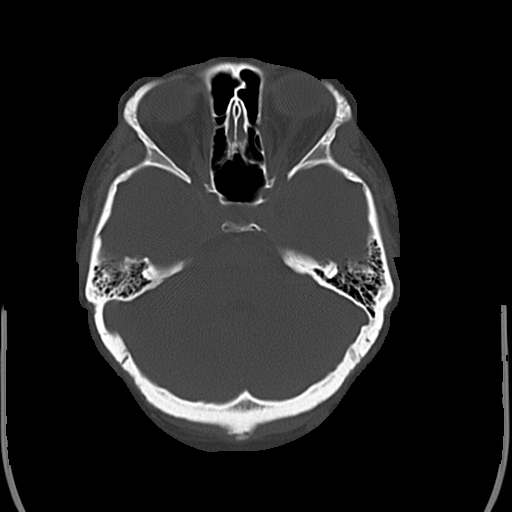
[im 18/53  bone]
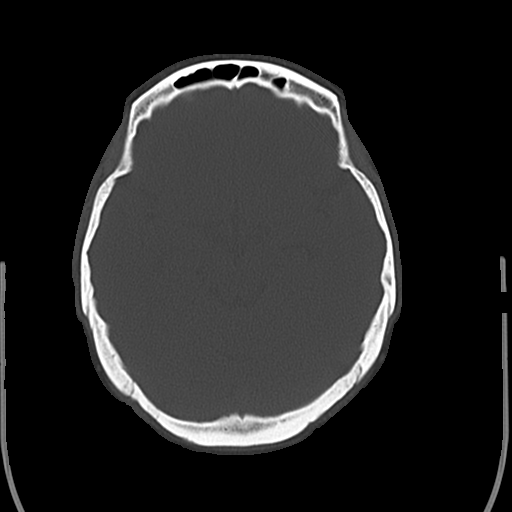
[im 24/53  bone]
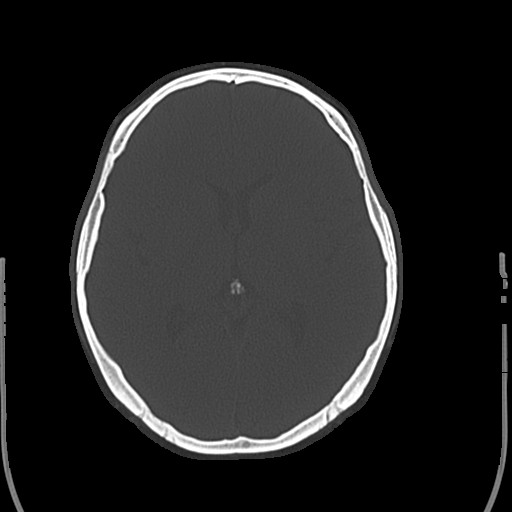
[im 29/53  bone]
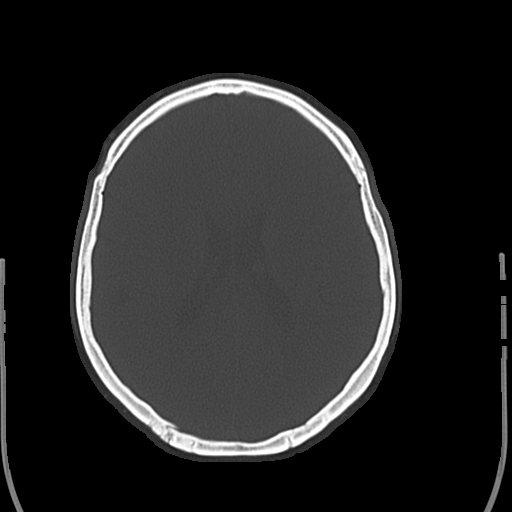
[im 35/53  bone]
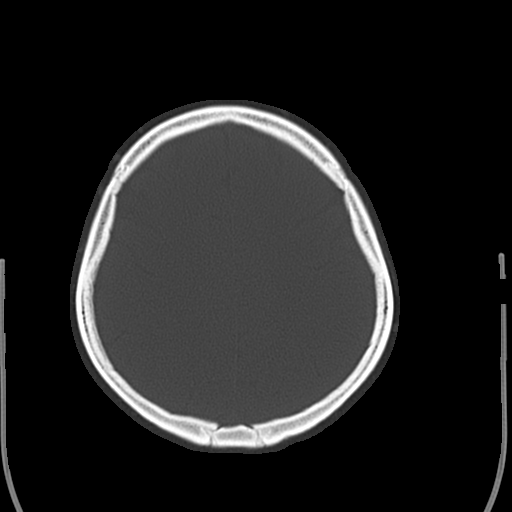
[im 41/53  bone]
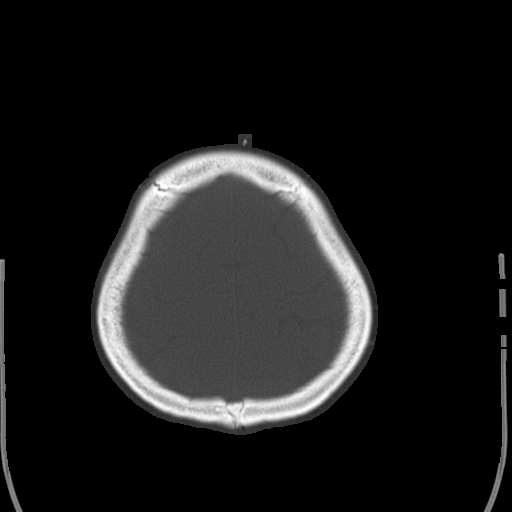
[im 47/53  bone]
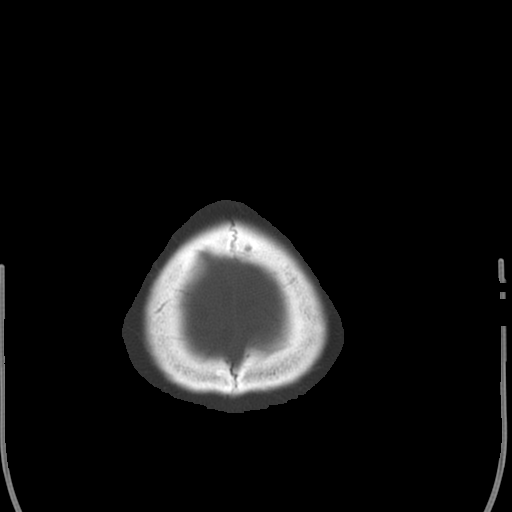

[17 of 30 positions shown; findings below may reference images not displayed]

FINDINGS: There is no evidence of mass effect, midline shift or extra-axial
fluid collections. There is no evidence of a space-occupying lesion
or intracranial hemorrhage. There is no evidence of a cortical-based
area of acute infarction.

The ventricles and sulci are appropriate for the patient's age. The
basal cisterns are patent.

Visualized portions of the orbits are unremarkable. The visualized
portions of the paranasal sinuses and mastoid air cells are
unremarkable.

The osseous structures are unremarkable.
IMPRESSION: Normal CT of the brain without intravenous contrast.
